# Patient Record
Sex: Female | Born: 1990
Health system: Southern US, Community
[De-identification: ages and names within clinical notes are randomized; demographics above are authoritative.]

## PROBLEM LIST (undated history)

## (undated) DIAGNOSIS — I10 Essential (primary) hypertension: Secondary | ICD-10-CM

## (undated) HISTORY — PX: PARTIAL HYSTERECTOMY: SHX80

---
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
## 2011-06-26 ENCOUNTER — Emergency Department (HOSPITAL_COMMUNITY)
Admission: EM | Admit: 2011-06-26 | Discharge: 2011-06-26 | Disposition: A | Discharge status: Home / Self Care | Payer: Medicaid Other | Attending: Emergency Medicine | Admitting: Emergency Medicine

## 2011-06-26 ENCOUNTER — Emergency Department (HOSPITAL_COMMUNITY): Payer: Medicaid Other

## 2011-06-26 ENCOUNTER — Encounter: Payer: Self-pay | Admitting: *Deleted

## 2011-06-26 DIAGNOSIS — S43109A Unspecified dislocation of unspecified acromioclavicular joint, initial encounter: Secondary | ICD-10-CM

## 2011-06-26 DIAGNOSIS — S92912A Unspecified fracture of left toe(s), initial encounter for closed fracture: Secondary | ICD-10-CM

## 2011-06-26 DIAGNOSIS — W010XXA Fall on same level from slipping, tripping and stumbling without subsequent striking against object, initial encounter: Secondary | ICD-10-CM | POA: Insufficient documentation

## 2011-06-26 DIAGNOSIS — S92919A Unspecified fracture of unspecified toe(s), initial encounter for closed fracture: Secondary | ICD-10-CM | POA: Insufficient documentation

## 2011-06-26 MED ORDER — OXYCODONE-ACETAMINOPHEN 5-325 MG PO TABS
1.0000 | ORAL_TABLET | Freq: Once | ORAL | Status: AC
  Administered 2011-06-26: 1 via ORAL
  Filled 2011-06-26: qty 1

## 2011-06-26 MED ORDER — OXYCODONE-ACETAMINOPHEN 5-325 MG PO TABS: 1.0000 | ORAL_TABLET | ORAL | Status: AC | PRN

## 2011-06-26 NOTE — ED Provider Notes (Signed)
History     CSN: [id number] Arrival date & time: 06/26/2011 10:42 PM  Chief Complaint  Patient presents with  . Fall   HPI Comments: Pt tripped while walking up steps, hitting her left 5th toe and landing on right shoulder No head injury No neck/back pain Reports bruising to left 5th toe  Patient is a 20 y.o. female presenting with fall. The history is provided by the patient.  Fall The accident occurred more than 2 days ago. The fall occurred while walking. The point of impact was the right shoulder. The pain is moderate. She was ambulatory at the scene. Pertinent negatives include no headaches and no loss of consciousness.    History reviewed. No pertinent past medical history.  History reviewed. No pertinent past surgical history.  No family history on file.  History  Substance Use Topics  . Smoking status: Never Smoker   . Smokeless tobacco: Not on file  . Alcohol Use: No    OB History    Grav Para Term Preterm Abortions TAB SAB Ect Mult Living                  Review of Systems  Musculoskeletal: Negative for back pain.  Neurological: Negative for loss of consciousness, weakness and headaches.    Physical Exam  BP 118/83  Pulse 107  Temp(Src) 98.2 F (36.8 C) (Oral)  Resp 18  Ht 5\' 3"  (1.6 m)  Wt 94 lb (42.638 kg)  BMI 16.65 kg/m2  SpO2 100%  LMP 06/03/2011  Physical Exam  CONSTITUTIONAL: Well developed/well nourished HEAD AND FACE: Normocephalic/atraumatic EYES: EOMI/PERRL ENMT: Mucous membranes moist NECK: supple no meningeal signs SPINE:entire spine nontender CV: S1/S2 noted, no murmurs/rubs/gallops noted LUNGS: Lungs are clear to auscultation bilaterally, no apparent distress ABDOMEN: soft, nontender, no rebound or guarding GU:no cva tenderness NEURO: Pt is awake/alert, moves all extremitiesx4 EXTREMITIES: pulses normal, full ROM, she is able to fully range the rightshoulder but is limited due to pain. No significant deformity to right  shoulder  Tender to palpation of left 5th toe with overlying bruising but no laceration.  No other bony tendernss to left foot/ankle SKIN: warm PSYCH: no abnormalities of mood noted   ED Course  Procedures  MDM Nursing notes reviewed and considered in documentation xrays reviewed and considered Pt reports she already has sling at home She reports she has orthopedist in [facility], MD 06/27/11 0105

## 2011-06-26 NOTE — ED Notes (Signed)
Pt reports she tripped going up some stairs 4 days ago landing on her rt shoulder and jamming her left 5th toe

## 2012-01-15 IMAGING — CR DG TOE 5TH 2+V*L*
2 series · 2 of 2 positions shown · non-contrast
Comparison: None.

CLINICAL DATA: Injury, pain.

LEFT TOE - 2+ VIEW

[view not recorded (1 of 2)]
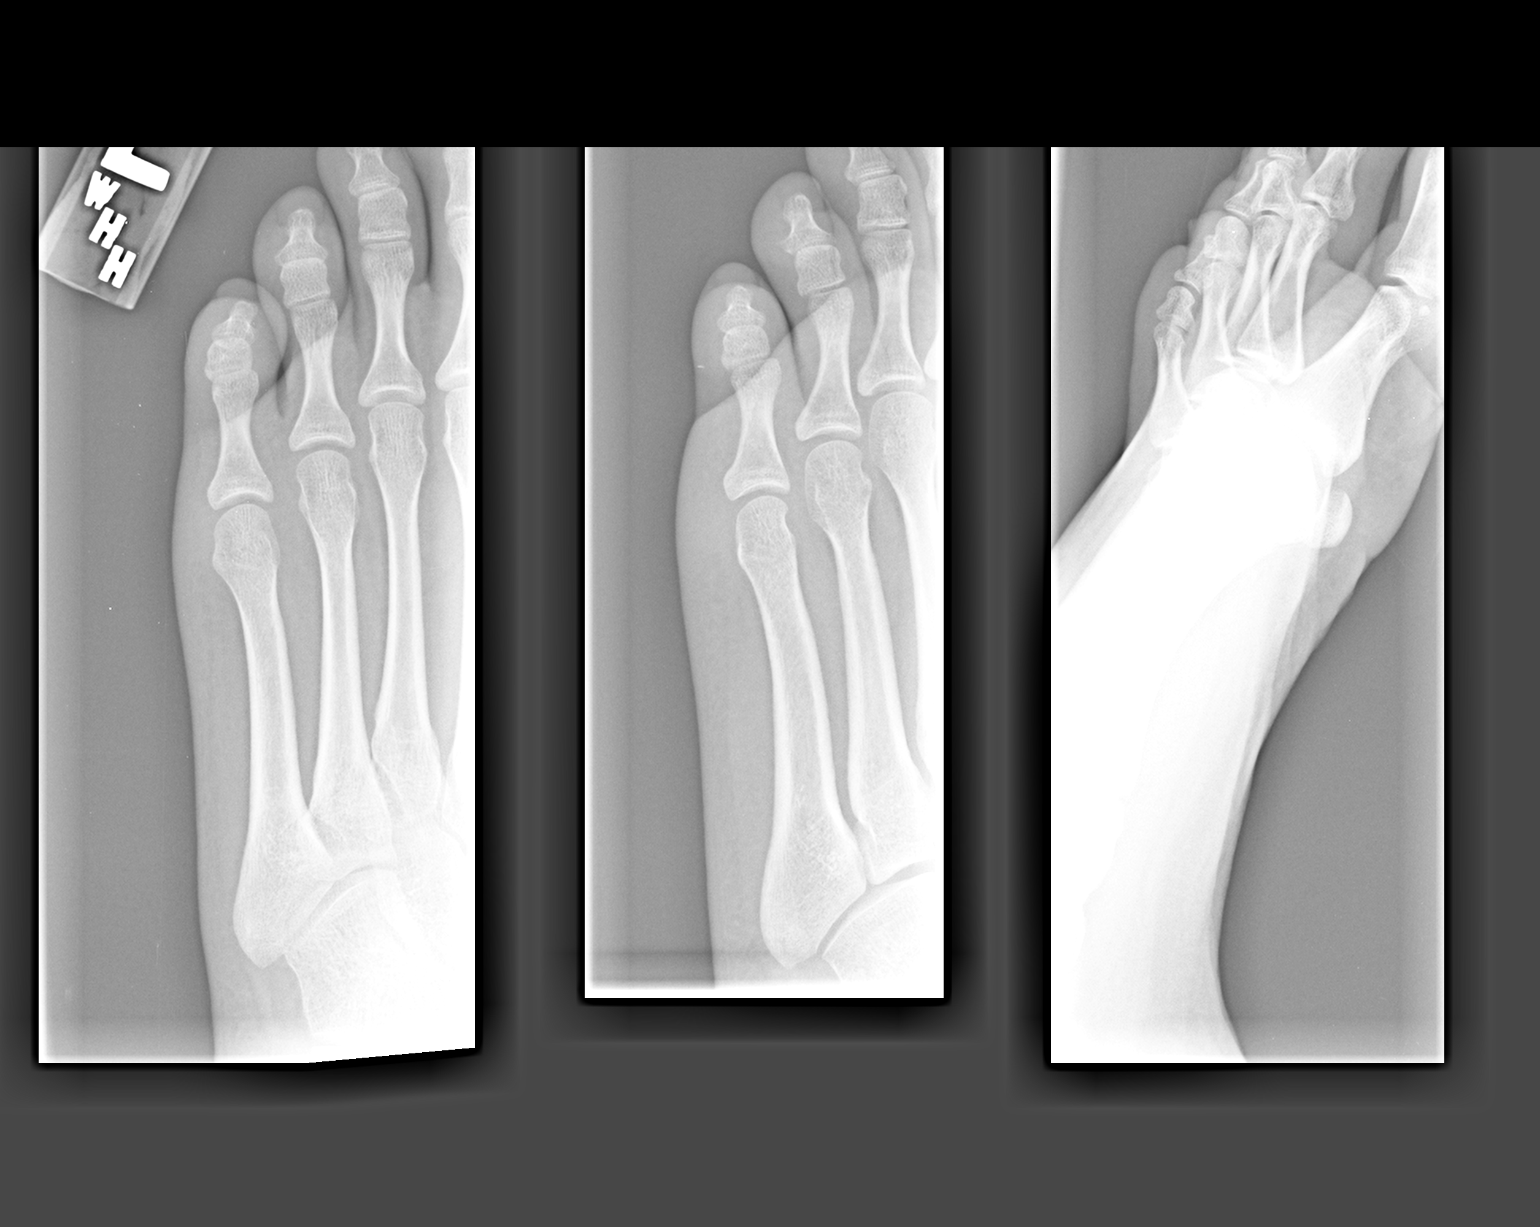

[view not recorded (2 of 2)]
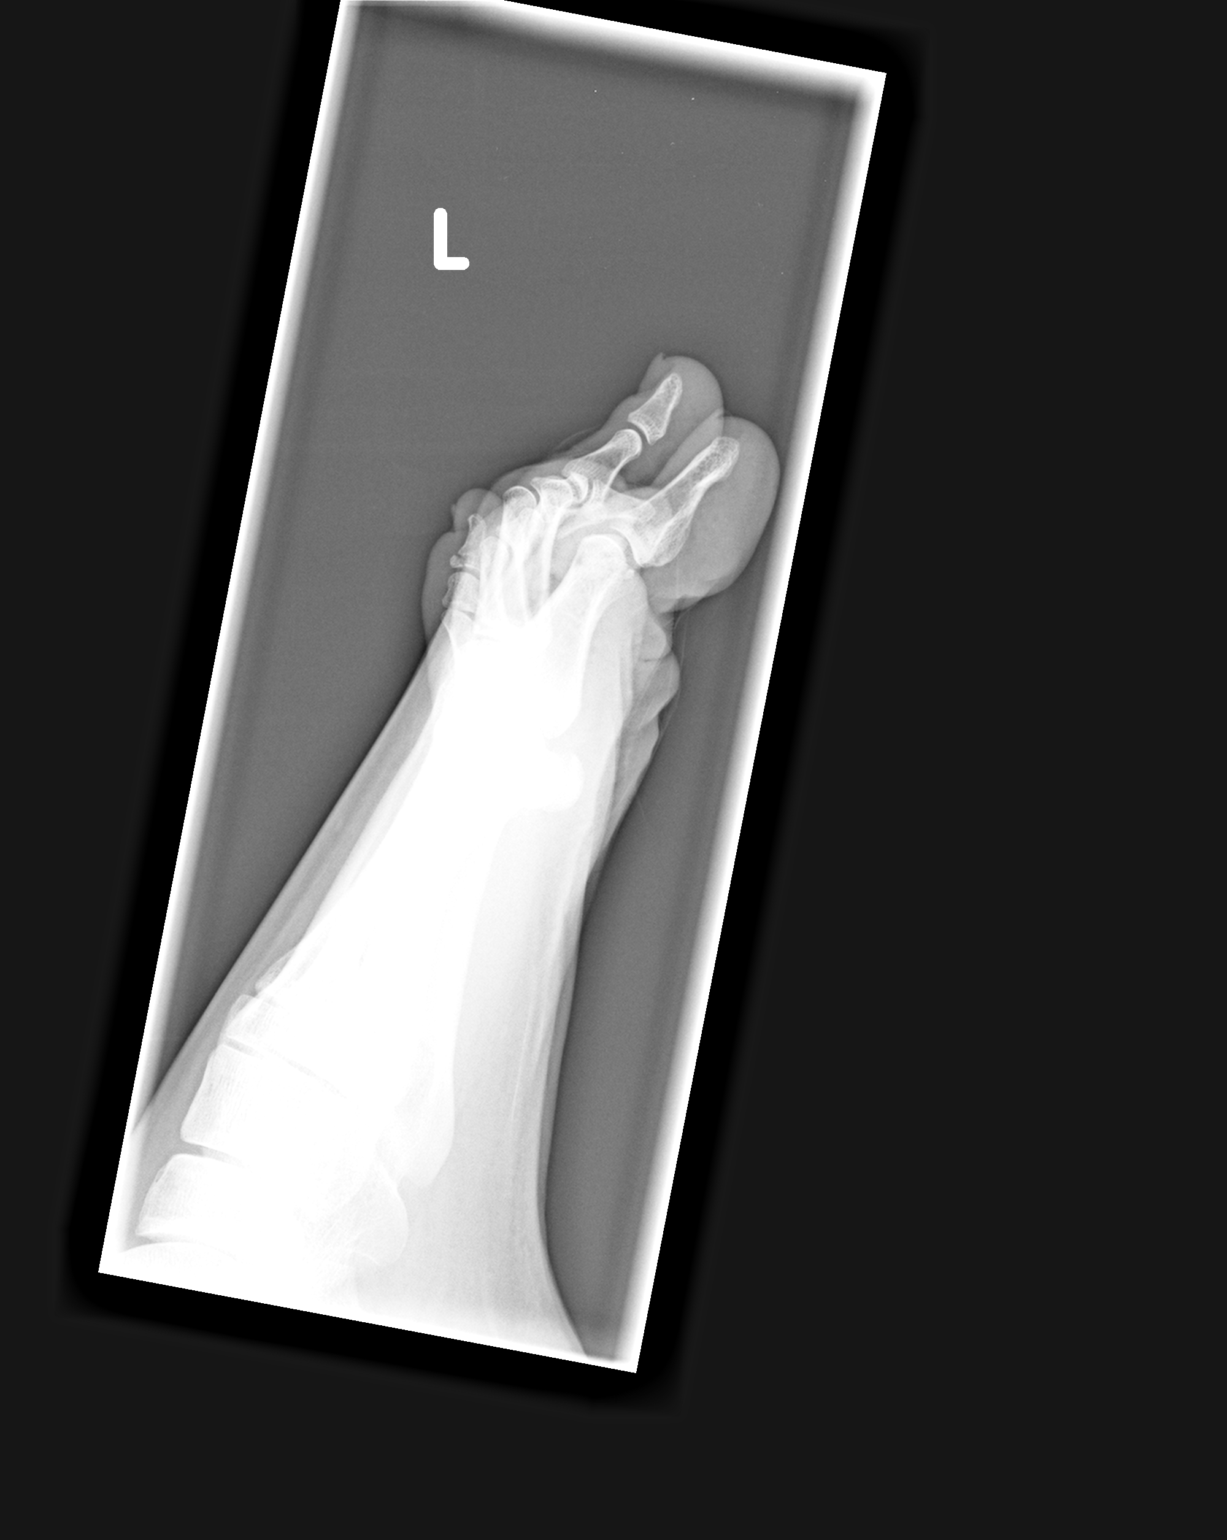

[2 of 2 positions shown; findings below may reference images not displayed]

FINDINGS: There is a dorsal plate fracture the distal phalanx of
the left fifth toe, best seen on the lateral view.  Minimal
displacement.  No additional acute bony abnormality.
IMPRESSION: Dorsal plate fracture through the distal phalanx of the left fifth
toe.

## 2012-10-24 ENCOUNTER — Encounter (HOSPITAL_COMMUNITY): Payer: Self-pay | Admitting: *Deleted

## 2012-10-24 ENCOUNTER — Emergency Department (HOSPITAL_COMMUNITY)
Admission: EM | Admit: 2012-10-24 | Discharge: 2012-10-24 | Disposition: A | Discharge status: Home / Self Care | Payer: Medicaid - Out of State | Attending: Emergency Medicine | Admitting: Emergency Medicine

## 2012-10-24 ENCOUNTER — Emergency Department (HOSPITAL_COMMUNITY): Payer: Medicaid - Out of State

## 2012-10-24 DIAGNOSIS — R071 Chest pain on breathing: Secondary | ICD-10-CM | POA: Insufficient documentation

## 2012-10-24 DIAGNOSIS — I10 Essential (primary) hypertension: Secondary | ICD-10-CM | POA: Insufficient documentation

## 2012-10-24 DIAGNOSIS — R000 Tachycardia, unspecified: Secondary | ICD-10-CM | POA: Insufficient documentation

## 2012-10-24 DIAGNOSIS — R079 Chest pain, unspecified: Secondary | ICD-10-CM

## 2012-10-24 HISTORY — DX: Essential (primary) hypertension: I10

## 2012-10-24 MED ORDER — OXYCODONE-ACETAMINOPHEN 5-325 MG PO TABS
ORAL_TABLET | ORAL | Status: AC
  Filled 2012-10-24: qty 1

## 2012-10-24 MED ORDER — IBUPROFEN 400 MG PO TABS
ORAL_TABLET | ORAL | Status: AC
  Filled 2012-10-24: qty 2

## 2012-10-24 MED ORDER — HYDROCODONE-ACETAMINOPHEN 5-325 MG PO TABS: 1.0000 | ORAL_TABLET | Freq: Four times a day (QID) | ORAL | Status: DC | PRN

## 2012-10-24 MED ORDER — IBUPROFEN 600 MG PO TABS: 600.0000 mg | ORAL_TABLET | Freq: Three times a day (TID) | ORAL | Status: DC | PRN

## 2012-10-24 MED ORDER — IBUPROFEN 400 MG PO TABS
600.0000 mg | ORAL_TABLET | Freq: Once | ORAL | Status: AC
  Administered 2012-10-24: 600 mg via ORAL

## 2012-10-24 MED ORDER — OXYCODONE-ACETAMINOPHEN 5-325 MG PO TABS
1.0000 | ORAL_TABLET | Freq: Once | ORAL | Status: AC
  Administered 2012-10-24: 1 via ORAL

## 2012-10-24 NOTE — ED Notes (Signed)
Pt c/o centralized chest pain with pressure. Pt also states that her heart feels like it is fluttering.

## 2012-10-24 NOTE — ED Provider Notes (Signed)
History     CSN: [id number]  Arrival date & time 10/24/12  5621   First MD Initiated Contact with Patient 10/24/12 0133      Chief Complaint  Patient presents with  . Chest Pain  . Tachycardia     The history is provided by the patient.   patient reports developing chest discomfort in her anterior central chest.  There is no radiation of her discomfort.  She states it feels like a heaviness.  It is worse with movement and palpation.  Her discomfort is worse when she lay flat and improves when she sits forward.  She tried Tylenol without improvement in her symptoms.  She is 21 years old.  No history of DVT or pulmonary embolism.  No history of coronary artery disease or early cardiac disease in family members.  No recent long travel or surgery.  The patient is on birth control.  She does not smoke cigarettes.  No fevers or chills.  No shortness of breath.  No abdominal pain nausea or vomiting.  No back pain.  Her pain is mild to moderate in severity.  Past Medical History  Diagnosis Date  . Hypertension     History reviewed. No pertinent past surgical history.  History reviewed. No pertinent family history.  History  Substance Use Topics  . Smoking status: Never Smoker   . Smokeless tobacco: Not on file  . Alcohol Use: No    OB History    Grav Para Term Preterm Abortions TAB SAB Ect Mult Living                  Review of Systems  Cardiovascular: Positive for chest pain.  All other systems reviewed and are negative.    Allergies  Review of patient's allergies indicates no known allergies.  Home Medications  No current outpatient prescriptions on file.  BP 118/79  Pulse 96  Temp 97.7 F (36.5 C)  Resp 20  Ht 5\' 2"  (1.575 m)  Wt 103 lb (46.72 kg)  BMI 18.84 kg/m2  SpO2 100%  LMP 10/16/2012  Physical Exam  Nursing note and vitals reviewed. Constitutional: She is oriented to person, place, and time. She appears well-developed and well-nourished. No  distress.  HENT:  Head: Normocephalic and atraumatic.  Eyes: EOM are normal.  Neck: Normal range of motion.  Cardiovascular: Normal rate, regular rhythm and normal heart sounds.   Pulmonary/Chest: Effort normal and breath sounds normal.       Tenderness to compression of anterior chest wall.  No rash noted.  Abdominal: Soft. She exhibits no distension. There is no tenderness.  Musculoskeletal: Normal range of motion.  Neurological: She is alert and oriented to person, place, and time.  Skin: Skin is warm and dry.  Psychiatric: She has a normal mood and affect. Judgment normal.    ED Course  Procedures (including critical care time)   Date: 10/24/2012  Rate: 77  Rhythm: normal sinus rhythm  QRS Axis: normal  Intervals: normal  ST/T Wave abnormalities: normal  Conduction Disutrbances: none  Narrative Interpretation:   Old EKG Reviewed: No significant changes noted     Labs Reviewed - No data to display Dg Chest 2 View  10/24/2012  *RADIOLOGY REPORT*  Clinical Data: Centralized chest pain and pressure.  CHEST - 2 VIEW  Comparison: None.  Findings: The lungs are well-aerated.  Mild bibasilar densities appear to reflect overlying soft tissues.  There is no evidence of focal opacification, pleural effusion or pneumothorax.  The heart is normal in size; the mediastinal contour is within normal limits.  No acute osseous abnormalities are seen.  IMPRESSION: No acute cardiopulmonary process seen.   Original Report Authenticated By: Tonia Ghent, M.D.    I personally reviewed the imaging tests through PACS system I reviewed available ER/hospitalization records through the EMR   1. Chest pain       MDM   PERC negative. cxr and ecg normal.  This is either pericarditis or costochondritis.  The patient be treated with pain medication and anti-inflammatories.  4:53 AM Patient feels much better at this time.  Discharge home in good condition.       Lyanne Co,  MD 10/24/12 [phone]

## 2012-10-24 NOTE — ED Notes (Signed)
Requests medication for chest pain.

## 2012-10-29 ENCOUNTER — Emergency Department (HOSPITAL_COMMUNITY)
Admission: EM | Admit: 2012-10-29 | Discharge: 2012-10-29 | Disposition: A | Discharge status: Home / Self Care | Payer: Medicaid - Out of State | Attending: Emergency Medicine | Admitting: Emergency Medicine

## 2012-10-29 ENCOUNTER — Encounter (HOSPITAL_COMMUNITY): Payer: Self-pay

## 2012-10-29 DIAGNOSIS — J3489 Other specified disorders of nose and nasal sinuses: Secondary | ICD-10-CM | POA: Insufficient documentation

## 2012-10-29 DIAGNOSIS — B338 Other specified viral diseases: Secondary | ICD-10-CM | POA: Insufficient documentation

## 2012-10-29 DIAGNOSIS — R05 Cough: Secondary | ICD-10-CM | POA: Insufficient documentation

## 2012-10-29 DIAGNOSIS — B349 Viral infection, unspecified: Secondary | ICD-10-CM

## 2012-10-29 DIAGNOSIS — J029 Acute pharyngitis, unspecified: Secondary | ICD-10-CM | POA: Insufficient documentation

## 2012-10-29 DIAGNOSIS — R059 Cough, unspecified: Secondary | ICD-10-CM | POA: Insufficient documentation

## 2012-10-29 DIAGNOSIS — I10 Essential (primary) hypertension: Secondary | ICD-10-CM | POA: Insufficient documentation

## 2012-10-29 LAB — RAPID STREP SCREEN (MED CTR MEBANE ONLY): Streptococcus, Group A Screen (Direct): NEGATIVE

## 2012-10-29 MED ORDER — GUAIFENESIN-CODEINE 100-10 MG/5ML PO SYRP: 10.0000 mL | ORAL_SOLUTION | Freq: Three times a day (TID) | ORAL | Status: AC | PRN

## 2012-10-29 MED ORDER — MAGIC MOUTHWASH W/LIDOCAINE: ORAL | Status: DC

## 2012-10-29 NOTE — ED Notes (Signed)
Pt reports sore throat since last night, thinks she may have strep throat. Also has cough

## 2012-10-29 NOTE — ED Notes (Signed)
Pt c/o sore throat and cough since last night. Pt states she has hx of strep throat and "this feels the same".

## 2012-10-29 NOTE — ED Provider Notes (Signed)
History     CSN: [id number]  Arrival date & time 10/29/12  1708   First MD Initiated Contact with Patient 10/29/12 1757      Chief Complaint  Patient presents with  . Sore Throat  . Cough    (Consider location/radiation/quality/duration/timing/severity/associated sxs/prior treatment) HPI Comments: Patient c/o occasionally productive cough and sore throat that began on the evening prior to ED arrival.  She denies fever, abd pain, vomiting , chest pain or shortness of breath  Patient is a 21 y.o. female presenting with pharyngitis. The history is provided by the patient.  Sore Throat This is a new problem. The current episode started yesterday. The problem occurs constantly. The problem has been unchanged. Associated symptoms include congestion, coughing and a sore throat. Pertinent negatives include no arthralgias, chest pain, chills, diaphoresis, fever, headaches, nausea, neck pain, numbness, rash, swollen glands, vertigo, vomiting or weakness. The symptoms are aggravated by swallowing. She has tried nothing for the symptoms. The treatment provided no relief.    Past Medical History  Diagnosis Date  . Hypertension     History reviewed. No pertinent past surgical history.  No family history on file.  History  Substance Use Topics  . Smoking status: Never Smoker   . Smokeless tobacco: Not on file  . Alcohol Use: No    OB History    Grav Para Term Preterm Abortions TAB SAB Ect Mult Living                  Review of Systems  Constitutional: Negative for fever, chills, diaphoresis, activity change and appetite change.  HENT: Positive for congestion, sore throat and rhinorrhea. Negative for facial swelling, trouble swallowing, neck pain and neck stiffness.   Eyes: Negative for visual disturbance.  Respiratory: Positive for cough. Negative for chest tightness, shortness of breath, wheezing and stridor.   Cardiovascular: Negative for chest pain.  Gastrointestinal: Negative  for nausea and vomiting.  Musculoskeletal: Negative for arthralgias.  Skin: Negative.  Negative for rash.  Neurological: Negative for dizziness, vertigo, weakness, numbness and headaches.  Hematological: Negative for adenopathy.  Psychiatric/Behavioral: Negative for confusion.  All other systems reviewed and are negative.    Allergies  Review of patient's allergies indicates no known allergies.  Home Medications   Current Outpatient Rx  Name  Route  Sig  Dispense  Refill  . IBUPROFEN 600 MG PO TABS   Oral   Take 1 tablet (600 mg total) by mouth every 8 (eight) hours as needed for pain.   15 tablet   0     BP 102/67  Pulse 110  Temp 98.3 F (36.8 C) (Oral)  Resp 20  SpO2 100%  LMP 10/16/2012  Physical Exam  Nursing note and vitals reviewed. Constitutional: She is oriented to person, place, and time. She appears well-developed and well-nourished. No distress.  HENT:  Head: Normocephalic and atraumatic. No trismus in the jaw.  Right Ear: Tympanic membrane and ear canal normal.  Left Ear: Tympanic membrane and ear canal normal.  Mouth/Throat: Uvula is midline and mucous membranes are normal. No uvula swelling. Posterior oropharyngeal edema and posterior oropharyngeal erythema present. No oropharyngeal exudate or tonsillar abscesses.  Neck: Normal range of motion. Neck supple.  Cardiovascular: Normal rate, regular rhythm, normal heart sounds and intact distal pulses.   No murmur heard. Pulmonary/Chest: Effort normal and breath sounds normal. No respiratory distress. She has no wheezes. She has no rales. She exhibits no tenderness.  Abdominal: Soft. She exhibits no distension.  There is no tenderness.  Musculoskeletal: Normal range of motion.  Lymphadenopathy:    She has no cervical adenopathy.  Neurological: She is alert and oriented to person, place, and time. She exhibits normal muscle tone. Coordination normal.  Skin: Skin is warm and dry.    ED Course  Procedures  (including critical care time)  Results for orders placed during the hospital encounter of 10/29/12  RAPID STREP SCREEN      Component Value Range   Streptococcus, Group A Screen (Direct) NEGATIVE  NEGATIVE         MDM    Patient is alert, nontoxic appearing. Previous ED chart reviewed by me patient denies chest pain today. Erythema of the oropharynx without exudates or edema. No tonsillar abscess. Rapid strep is negative , will treat patient symptomatically   Prescribed: Guaifenesin with codeine Magic mouthwash     Spencer Peterkin L. [place], [place] 10/31/12 2127

## 2012-10-31 NOTE — ED Provider Notes (Signed)
Medical screening examination/treatment/procedure(s) were performed by non-physician practitioner and as supervising physician I was immediately available for consultation/collaboration.  Anabelen Kaminsky, MD 10/31/12 2212 

## 2012-12-12 ENCOUNTER — Encounter (HOSPITAL_COMMUNITY): Payer: Self-pay | Admitting: *Deleted

## 2012-12-12 ENCOUNTER — Emergency Department (HOSPITAL_COMMUNITY)
Admission: EM | Admit: 2012-12-12 | Discharge: 2012-12-13 | Disposition: A | Discharge status: Home / Self Care | Payer: Medicaid - Out of State | Attending: Emergency Medicine | Admitting: Emergency Medicine

## 2012-12-12 DIAGNOSIS — I10 Essential (primary) hypertension: Secondary | ICD-10-CM | POA: Insufficient documentation

## 2012-12-12 DIAGNOSIS — N390 Urinary tract infection, site not specified: Secondary | ICD-10-CM | POA: Insufficient documentation

## 2012-12-12 DIAGNOSIS — H6123 Impacted cerumen, bilateral: Secondary | ICD-10-CM

## 2012-12-12 DIAGNOSIS — R112 Nausea with vomiting, unspecified: Secondary | ICD-10-CM | POA: Insufficient documentation

## 2012-12-12 DIAGNOSIS — B9789 Other viral agents as the cause of diseases classified elsewhere: Secondary | ICD-10-CM | POA: Insufficient documentation

## 2012-12-12 DIAGNOSIS — H6120 Impacted cerumen, unspecified ear: Secondary | ICD-10-CM | POA: Insufficient documentation

## 2012-12-12 DIAGNOSIS — E860 Dehydration: Secondary | ICD-10-CM | POA: Insufficient documentation

## 2012-12-12 DIAGNOSIS — B349 Viral infection, unspecified: Secondary | ICD-10-CM

## 2012-12-12 DIAGNOSIS — R509 Fever, unspecified: Secondary | ICD-10-CM | POA: Insufficient documentation

## 2012-12-12 DIAGNOSIS — E876 Hypokalemia: Secondary | ICD-10-CM | POA: Insufficient documentation

## 2012-12-12 LAB — POCT I-STAT, CHEM 8
Calcium, Ion: 1.1 mmol/L — ABNORMAL LOW (ref 1.12–1.23)
Creatinine, Ser: 0.8 mg/dL (ref 0.50–1.10)
Glucose, Bld: 96 mg/dL (ref 70–99)
Hemoglobin: 13.6 g/dL (ref 12.0–15.0)
Potassium: 2.7 mEq/L — CL (ref 3.5–5.1)
TCO2: 24 mmol/L (ref 0–100)

## 2012-12-12 MED ORDER — POTASSIUM CHLORIDE CRYS ER 20 MEQ PO TBCR
40.0000 meq | EXTENDED_RELEASE_TABLET | Freq: Once | ORAL | Status: AC
  Administered 2012-12-12: 40 meq via ORAL
  Filled 2012-12-12: qty 2

## 2012-12-12 MED ORDER — SODIUM CHLORIDE 0.9 % IV SOLN
1000.0000 mL | Freq: Once | INTRAVENOUS | Status: AC
  Administered 2012-12-12: 1000 mL via INTRAVENOUS

## 2012-12-12 MED ORDER — SODIUM CHLORIDE 0.9 % IV SOLN
1000.0000 mL | INTRAVENOUS | Status: DC
  Administered 2012-12-12 – 2012-12-13 (×2): 1000 mL via INTRAVENOUS

## 2012-12-12 MED ORDER — ACETAMINOPHEN 500 MG PO TABS
1000.0000 mg | ORAL_TABLET | Freq: Once | ORAL | Status: AC
  Administered 2012-12-12: 1000 mg via ORAL
  Filled 2012-12-12: qty 2

## 2012-12-12 MED ORDER — ONDANSETRON HCL 4 MG/2ML IJ SOLN
4.0000 mg | Freq: Once | INTRAMUSCULAR | Status: AC
  Administered 2012-12-12: 4 mg via INTRAVENOUS
  Filled 2012-12-12: qty 2

## 2012-12-12 NOTE — ED Notes (Addendum)
Vomiting, no diarrhea, lt ear hurts. Feels weak.   IUD was removed Thursday, has had vaginal bleeding since then.  Took motrin 800 at 7 pm

## 2012-12-12 NOTE — ED Provider Notes (Signed)
History    This chart was scribed for Scheffler Givens, MD by Charolett Bumpers, ED Scribe. The patient was seen in room [facility]18[facility]. Patient's care was started at 22:15.   CSN: [id number]  Arrival date & time 12/12/12  2135  First MD Initiated Contact with Patient 12/12/2012 2215     Chief Complaint  Patient presents with  . Emesis     The history is provided by the patient. No language interpreter was used.   Olivia Cordova is a 22 y.o. female who presents to the Emergency Department complaining of persistent emesis with associated nausea that started 2 days ago. She reports vomiting 8-9 times daily. She reports a fever of 100.5. Temperature here in ED is 101.5. She denies any abdominal pain, diarrhea, sneezing, dysuria, increased urination, dysuria. She does feel weak.  She also complains of left ear pain that started 2-3 days ago. She states that she had her IUD removed 4 days ago that was placed in Sept 2013 due to a recall. She states that she has had vaginal bleeding since. She last took Motrin at 7 pm tonight. 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 Hypertension     History reviewed. No pertinent past surgical history.  History reviewed. No pertinent family history.  History  Substance Use Topics  . Smoking status: Never Smoker   . Smokeless tobacco: Not on file  . Alcohol Use: No  She denies alcohol or tobacco use She works as an Lawyer at [employer] History   Grav Para Term Preterm Abortions TAB SAB Ect Mult Living                  Review of Systems  Constitutional: Positive for fever. Negative for appetite change.  HENT: Positive for ear pain. Negative for hearing loss.   Gastrointestinal: Positive for nausea and vomiting. Negative for abdominal pain and diarrhea.  All other systems reviewed and are negative.    Allergies  Review of patient's allergies indicates no known  allergies.  Home Medications   Current Outpatient Rx  Name  Route  Sig  Dispense  Refill  . Homeopathic Products (EARACHE RELIEF OT)   Otic   Place 2-4 drops in ear(s) every 4 (four) hours as needed (for relief).         Marland Kitchen ibuprofen (ADVIL,MOTRIN) 200 MG tablet   Oral   Take 800 mg by mouth 2 (two) times daily as needed for pain.           Triage Vitals: BP 116/68  Pulse 129  Temp(Src) 101.5 F (38.6 C) (Oral)  Resp 18  Ht 5\' 2"  (1.575 m)  Wt 82 lb (37.195 kg)  BMI 14.99 kg/m2  SpO2 100%  LMP 12/08/2012  Vital signs normal except for fever, tachycardia   Physical Exam  Nursing note and vitals reviewed. Constitutional: She is oriented to person, place, and time. She appears well-developed and well-nourished. No distress.  HENT:  Head: Normocephalic and atraumatic.  Right Ear: External ear normal.  Left Ear: External ear normal.  Nose: Nose normal.  Mouth/Throat: Oropharynx is clear and moist. No oropharyngeal exudate.  Cerumen impactions bilaterally with the left worse than right. Can hear fine finger rubbing bilaterally. Tongue is moist.   Eyes: Conjunctivae and EOM are normal. Pupils are equal, round, and reactive to light.  Neck: Normal range of  motion. Neck supple. No tracheal deviation present.  Cardiovascular: Normal rate, regular rhythm and normal heart sounds.  Exam reveals no gallop and no friction rub.   No murmur heard. Pulmonary/Chest: Effort normal and breath sounds normal. No respiratory distress. She has no wheezes. She has no rhonchi. She has no rales.  Abdominal: Soft. Bowel sounds are normal. She exhibits no distension. There is no tenderness. There is no rebound and no guarding.  Musculoskeletal: Normal range of motion. She exhibits no edema and no tenderness.  Neurological: She is alert and oriented to person, place, and time.  Skin: Skin is warm and dry.  Psychiatric: She has a normal mood and affect. Her behavior is normal.    ED Course   Procedures (including critical care time)  Medications  0.9 %  sodium chloride infusion (0 mLs Intravenous Stopped 12/12/12 2323)    Followed by  0.9 %  sodium chloride infusion (1,000 mLs Intravenous New Bag/Given 12/12/12 2323)  ibuprofen (ADVIL,MOTRIN) tablet 800 mg (not administered)  cefTRIAXone (ROCEPHIN) 1 g in dextrose 5 % 50 mL IVPB (not administered)  ondansetron (ZOFRAN) injection 4 mg (4 mg Intravenous Given 12/12/12 2241)  acetaminophen (TYLENOL) tablet 1,000 mg (1,000 mg Oral Given 12/12/12 2246)  potassium chloride SA (K-DUR,KLOR-CON) CR tablet 40 mEq (40 mEq Oral Given 12/12/12 2335)  metoCLOPramide (REGLAN) injection 10 mg (10 mg Intravenous Given 12/13/12 0004)  diphenhydrAMINE (BENADRYL) injection 25 mg ( Intravenous Given 12/13/12 0004)  sodium chloride 0.9 % bolus 1,000 mL (1,000 mLs Intravenous Bolus from Bag 12/13/12 0006)    DIAGNOSTIC STUDIES: Oxygen Saturation is 100% on room air, normal by my interpretation.    COORDINATION OF CARE:  22:21-Discussed planned course of treatment with the patient including IV fluids and Zofran, who is agreeable at this time.   Pt still having fever. Pt getting IV fluids. Started on oral potassium. She was given IV rocephin for her UTI. Pt states she doesn't want to be admitted, wants to go home.   Dr Colon Branch will determine her disposition time after change of shift.   Results for orders placed during the hospital encounter of 12/12/12  URINALYSIS, ROUTINE W REFLEX MICROSCOPIC      Result Value Range   Color, Urine YELLOW  YELLOW   APPearance CLOUDY (*) CLEAR   Specific Gravity, Urine 1.015  1.005 - 1.030   pH 6.0  5.0 - 8.0   Glucose, UA NEGATIVE  NEGATIVE mg/dL   Hgb urine dipstick SMALL (*) NEGATIVE   Bilirubin Urine NEGATIVE  NEGATIVE   Ketones, ur >80 (*) NEGATIVE mg/dL   Protein, ur 30 (*) NEGATIVE mg/dL   Urobilinogen, UA 0.2  0.0 - 1.0 mg/dL   Nitrite POSITIVE (*) NEGATIVE   Leukocytes, UA MODERATE (*) NEGATIVE  URINE  MICROSCOPIC-ADD ON      Result Value Range   WBC, UA TOO NUMEROUS TO COUNT  <3 WBC/hpf   RBC / HPF 3-6  <3 RBC/hpf   Bacteria, UA MANY (*) RARE  POCT I-STAT, CHEM 8      Result Value Range   Sodium 136  135 - 145 mEq/L   Potassium 2.7 (*) 3.5 - 5.1 mEq/L   Chloride 101  96 - 112 mEq/L   BUN 5 (*) 6 - 23 mg/dL   Creatinine, Ser 1.61  0.50 - 1.10 mg/dL   Glucose, Bld 96  70 - 99 mg/dL   Calcium, Ion 0.96 (*) 1.12 - 1.23 mmol/L   TCO2 24  0 -  100 mmol/L   Hemoglobin 13.6  12.0 - 15.0 g/dL   HCT 16.1  09.6 - 04.5 %   Comment NOTIFIED PHYSICIAN     Laboratory interpretation all normal except hypokalemia and UTI    1. Fever   2. Nausea and vomiting   3. Cerumen impaction, bilateral   4. Hypokalemia   5. Viral illness   6. Dehydration   7. UTI (lower urinary tract infection)    New Prescriptions   CEPHALEXIN (KEFLEX) 500 MG CAPSULE    Take 1 capsule (500 mg total) by mouth 3 (three) times daily.   DOCUSATE (COLACE) 50 MG/5ML LIQUID    Fill ear canals and let sit for 30 minutes then flush with warm water   ONDANSETRON (ZOFRAN ODT) 8 MG DISINTEGRATING TABLET    Take 1 tablet (8 mg total) by mouth every 8 (eight) hours as needed for nausea.   POTASSIUM CHLORIDE SA (K-DUR,KLOR-CON) 20 MEQ TABLET    Take 1 tablet (20 mEq total) by mouth 2 (two) times daily.    Plan discharge  Devoria Albe, MD, FACEP   MDM     I personally performed the services described in this documentation, which was scribed in my presence. The recorded information has been reviewed and considered.  Devoria Albe, MD, Armando Gang      Shadrick Givens, MD 12/13/12 Jacinta Shoe

## 2012-12-13 ENCOUNTER — Encounter (HOSPITAL_COMMUNITY): Payer: Self-pay | Admitting: *Deleted

## 2012-12-13 ENCOUNTER — Inpatient Hospital Stay (HOSPITAL_COMMUNITY)
Admission: EM | Admit: 2012-12-13 | Discharge: 2012-12-15 | DRG: 690 | Disposition: A | Discharge status: Home / Self Care | Payer: Medicaid - Out of State | Attending: Internal Medicine | Admitting: Internal Medicine

## 2012-12-13 DIAGNOSIS — N12 Tubulo-interstitial nephritis, not specified as acute or chronic: Secondary | ICD-10-CM | POA: Diagnosis present

## 2012-12-13 DIAGNOSIS — A498 Other bacterial infections of unspecified site: Secondary | ICD-10-CM | POA: Diagnosis present

## 2012-12-13 DIAGNOSIS — Z79899 Other long term (current) drug therapy: Secondary | ICD-10-CM

## 2012-12-13 DIAGNOSIS — E876 Hypokalemia: Secondary | ICD-10-CM | POA: Diagnosis not present

## 2012-12-13 DIAGNOSIS — D649 Anemia, unspecified: Secondary | ICD-10-CM | POA: Diagnosis present

## 2012-12-13 DIAGNOSIS — E875 Hyperkalemia: Secondary | ICD-10-CM | POA: Diagnosis present

## 2012-12-13 DIAGNOSIS — N10 Acute tubulo-interstitial nephritis: Principal | ICD-10-CM

## 2012-12-13 DIAGNOSIS — R1110 Vomiting, unspecified: Secondary | ICD-10-CM | POA: Diagnosis present

## 2012-12-13 DIAGNOSIS — E860 Dehydration: Secondary | ICD-10-CM | POA: Diagnosis present

## 2012-12-13 DIAGNOSIS — I10 Essential (primary) hypertension: Secondary | ICD-10-CM | POA: Diagnosis present

## 2012-12-13 DIAGNOSIS — R112 Nausea with vomiting, unspecified: Secondary | ICD-10-CM

## 2012-12-13 LAB — URINE MICROSCOPIC-ADD ON

## 2012-12-13 LAB — URINALYSIS, ROUTINE W REFLEX MICROSCOPIC
Bilirubin Urine: NEGATIVE
Glucose, UA: NEGATIVE mg/dL
Glucose, UA: NEGATIVE mg/dL
Protein, ur: 30 mg/dL — AB
Specific Gravity, Urine: 1.02 (ref 1.005–1.030)

## 2012-12-13 LAB — CBC WITH DIFFERENTIAL/PLATELET
Basophils Absolute: 0 10*3/uL (ref 0.0–0.1)
Eosinophils Absolute: 0 10*3/uL (ref 0.0–0.7)
Eosinophils Relative: 0 % (ref 0–5)
Lymphs Abs: 1.5 10*3/uL (ref 0.7–4.0)
MCH: 29.3 pg (ref 26.0–34.0)
MCV: 86.8 fL (ref 78.0–100.0)
Platelets: 328 10*3/uL (ref 150–400)
RDW: 13.5 % (ref 11.5–15.5)

## 2012-12-13 LAB — POCT I-STAT, CHEM 8
BUN: 4 mg/dL — ABNORMAL LOW (ref 6–23)
Calcium, Ion: 1.1 mmol/L — ABNORMAL LOW (ref 1.12–1.23)
Creatinine, Ser: 0.6 mg/dL (ref 0.50–1.10)
TCO2: 22 mmol/L (ref 0–100)

## 2012-12-13 MED ORDER — METOCLOPRAMIDE HCL 5 MG/ML IJ SOLN
10.0000 mg | Freq: Once | INTRAMUSCULAR | Status: AC
  Administered 2012-12-13: 10 mg via INTRAVENOUS
  Filled 2012-12-13: qty 2

## 2012-12-13 MED ORDER — ACETAMINOPHEN 325 MG PO TABS: 650.0000 mg | ORAL_TABLET | ORAL | Status: DC | PRN

## 2012-12-13 MED ORDER — DEXTROSE 5 % IV SOLN
1.0000 g | INTRAVENOUS | Status: DC
  Filled 2012-12-13: qty 10

## 2012-12-13 MED ORDER — IBUPROFEN 800 MG PO TABS
800.0000 mg | ORAL_TABLET | Freq: Once | ORAL | Status: AC
  Administered 2012-12-13: 800 mg via ORAL
  Filled 2012-12-13: qty 1

## 2012-12-13 MED ORDER — KETOROLAC TROMETHAMINE 30 MG/ML IJ SOLN
30.0000 mg | Freq: Once | INTRAMUSCULAR | Status: AC
  Administered 2012-12-13: 30 mg via INTRAVENOUS
  Filled 2012-12-13: qty 1

## 2012-12-13 MED ORDER — SODIUM CHLORIDE 0.9 % IV SOLN
1000.0000 mL | Freq: Once | INTRAVENOUS | Status: AC
  Administered 2012-12-13: 1000 mL via INTRAVENOUS

## 2012-12-13 MED ORDER — ONDANSETRON HCL 4 MG/2ML IJ SOLN
4.0000 mg | INTRAMUSCULAR | Status: DC | PRN
  Administered 2012-12-14: 4 mg via INTRAVENOUS
  Filled 2012-12-13: qty 2

## 2012-12-13 MED ORDER — SODIUM CHLORIDE 0.9 % IV SOLN
1000.0000 mL | INTRAVENOUS | Status: DC
  Administered 2012-12-13: 1000 mL via INTRAVENOUS

## 2012-12-13 MED ORDER — MORPHINE SULFATE 4 MG/ML IJ SOLN
4.0000 mg | Freq: Once | INTRAMUSCULAR | Status: AC
  Administered 2012-12-13: 4 mg via INTRAVENOUS
  Filled 2012-12-13: qty 1

## 2012-12-13 MED ORDER — ONDANSETRON 8 MG PO TBDP: 8.0000 mg | ORAL_TABLET | Freq: Three times a day (TID) | ORAL | Status: DC | PRN

## 2012-12-13 MED ORDER — SODIUM CHLORIDE 0.9 % IV SOLN
INTRAVENOUS | Status: DC
  Administered 2012-12-13: 23:00:00 via INTRAVENOUS

## 2012-12-13 MED ORDER — DIPHENHYDRAMINE HCL 25 MG PO CAPS
50.0000 mg | ORAL_CAPSULE | Freq: Four times a day (QID) | ORAL | Status: DC | PRN
  Administered 2012-12-13: 50 mg via ORAL
  Filled 2012-12-13: qty 2

## 2012-12-13 MED ORDER — DEXTROSE 5 % IV SOLN
1.0000 g | Freq: Once | INTRAVENOUS | Status: AC
  Administered 2012-12-13: 1 g via INTRAVENOUS
  Filled 2012-12-13: qty 10

## 2012-12-13 MED ORDER — DIPHENHYDRAMINE HCL 50 MG/ML IJ SOLN
25.0000 mg | Freq: Once | INTRAMUSCULAR | Status: AC
  Administered 2012-12-13: via INTRAVENOUS
  Filled 2012-12-13: qty 1

## 2012-12-13 MED ORDER — SODIUM CHLORIDE 0.9 % IV BOLUS (SEPSIS)
1000.0000 mL | Freq: Once | INTRAVENOUS | Status: AC
  Administered 2012-12-13: 1000 mL via INTRAVENOUS

## 2012-12-13 MED ORDER — DIPHENHYDRAMINE HCL 50 MG/ML IJ SOLN
25.0000 mg | Freq: Once | INTRAMUSCULAR | Status: AC
  Administered 2012-12-13: 25 mg via INTRAVENOUS
  Filled 2012-12-13: qty 1

## 2012-12-13 MED ORDER — ENOXAPARIN SODIUM 40 MG/0.4ML ~~LOC~~ SOLN: 40.0000 mg | SUBCUTANEOUS | Status: DC

## 2012-12-13 MED ORDER — POTASSIUM CHLORIDE CRYS ER 20 MEQ PO TBCR: 20.0000 meq | EXTENDED_RELEASE_TABLET | Freq: Two times a day (BID) | ORAL | Status: DC

## 2012-12-13 MED ORDER — ENOXAPARIN SODIUM 30 MG/0.3ML ~~LOC~~ SOLN
30.0000 mg | SUBCUTANEOUS | Status: DC
  Administered 2012-12-14: 30 mg via SUBCUTANEOUS
  Filled 2012-12-13: qty 0.3

## 2012-12-13 MED ORDER — DOCUSATE SODIUM 50 MG/5ML PO LIQD: ORAL | Status: DC

## 2012-12-13 MED ORDER — CEPHALEXIN 500 MG PO CAPS: 500.0000 mg | ORAL_CAPSULE | Freq: Three times a day (TID) | ORAL | Status: DC

## 2012-12-13 MED ORDER — IBUPROFEN 800 MG PO TABS
400.0000 mg | ORAL_TABLET | Freq: Four times a day (QID) | ORAL | Status: DC | PRN
  Administered 2012-12-14: 400 mg via ORAL
  Filled 2012-12-13: qty 1

## 2012-12-13 MED ORDER — DEXTROSE 5 % IV SOLN
1.0000 g | Freq: Once | INTRAVENOUS | Status: AC
  Administered 2012-12-13: 1 g via INTRAVENOUS
  Filled 2012-12-13 (×2): qty 10

## 2012-12-13 MED ORDER — TRAZODONE HCL 50 MG PO TABS: 50.0000 mg | ORAL_TABLET | Freq: Every evening | ORAL | Status: DC | PRN

## 2012-12-13 NOTE — H&P (Signed)
[facility] History and Physical  Olivia Cordova  ZOX:[id number]  DOB: 11-16-1990   DOA: 12/13/2012   PCP:   Tilda Burrow, MD   Chief Complaint:  Nausea and vomiting worse today  HPI: Olivia Cordova is an 22 y.o. female.   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 She self medicated with ibuprofen 800 mg; symptoms progressed to include nausea and vomiting, and eventually came to the emergency room yesterday when symptoms fail to resolve. She was evaluated found to have a urinary tract infection and sent home with Keflex.  However vomiting persists, she is unable to keep down medications, and she is having increased frequency and dysuria. The back pain is over her entire lower back, but is accentuated on the right side.  Rewiew of Systems:   All systems negative except as marked bold or noted in the HPI;  Constitutional:    malaise, fever and chills. ;  Eyes:   eye pain, redness and discharge. ;  ENMT:   ear pain, hoarseness, nasal congestion, sinus pressure and sore throat. ;  Cardiovascular:    chest pain, palpitations, diaphoresis, dyspnea and peripheral edema.  Respiratory:   cough, hemoptysis, wheezing and stridor. ;  Gastrointestinal:  nausea, vomiting, diarrhea, constipation, abdominal pain, melena, blood in stool, hematemesis, jaundice and rectal bleeding. unusual weight loss..   Genitourinary:    frequency, dysuria, incontinence,flank pain and hematuria; Musculoskeletal:   back pain and neck pain.  swelling and trauma.;  Skin: .  pruritus, rash, abrasions, bruising and skin lesion.; ulcerations Neuro:    headache, lightheadedness and neck stiffness.  weakness, altered level of consciousness, altered mental status, extremity weakness, burning feet, involuntary movement, seizure and syncope.  Psych:    anxiety,  depression, insomnia, tearfulness, panic attacks, hallucinations, paranoia, suicidal or homicidal ideation     Past Medical History  Diagnosis Date  . Hypertension     History reviewed. No pertinent past surgical history.  Medications:  HOME MEDS: Prior to Admission medications   Medication Sig Start Date End Date Taking? Authorizing Provider  cephALEXin (KEFLEX) 500 MG capsule Take 1 capsule (500 mg total) by mouth 3 (three) times daily. 12/13/12  Yes Soth Givens, MD  ibuprofen (ADVIL,MOTRIN) 200 MG tablet Take 800 mg by mouth 2 (two) times daily as needed for pain.   Yes Historical Provider, MD  ondansetron (ZOFRAN ODT) 8 MG disintegrating tablet Take 1 tablet (8 mg total) by mouth every 8 (eight) hours as needed for nausea. 12/13/12  Yes Manfredonia Givens, MD  potassium chloride SA (K-DUR,KLOR-CON) 20 MEQ tablet Take 1 tablet (20 mEq total) by mouth 2 (two) times daily. 12/13/12  Yes Ziemann Givens, MD  docusate (COLACE) 50 MG/5ML liquid Fill ear canals and let sit for 30 minutes then flush with warm water 12/13/12   Kilker Givens, MD     Allergies:  No Known Allergies  Social History:   reports that she has never smoked. She does not have any smokeless tobacco history on file. She reports that she does not drink alcohol or use illicit drugs.  Family History: No family history on file. No family history of kidney troubles  Physical Exam: Filed Vitals:   12/13/12 1916  BP: 120/70  Pulse: 114  Temp: 100.2 F (37.9 C)  TempSrc: Oral  Resp: 18  Height:  5\' 2"  (1.575 m)  Weight: 37.195 kg (82 lb)  SpO2: 100%   Blood pressure 120/70, pulse 114, temperature 100.2 F (37.9 C), temperature source Oral, resp. rate 18, height 5\' 2"  (1.575 m), weight 37.195 kg (82 lb), last menstrual period 12/08/2012, SpO2 100.00%.  GEN:  Pleasant young Caucasian lady lying in the stretcher in no acute distress; cooperative with exam PSYCH:  alert and oriented x4; does not appear anxious or depressed;  affect is appropriate. HEENT: Mucous membranes pink,dry, and anicteric; PERRLA; EOM intact; no cervical lymphadenopathy nor thyromegaly or carotid bruit; no JVD; Breasts:: Not examined CHEST WALL: No tenderness CHEST: Normal respiration, clear to auscultation bilaterally HEART: Tachycardic regular rate and rhythm; no murmurs rubs or gallops BACK: No kyphosis or scoliosis; right CVA tenderness ABDOMEN: Scaphoid, soft non-tender; no masses, no organomegaly, normal abdominal bowel sounds; no pannus; no intertriginous candida. Rectal Exam: Not done EXTREMITIES: No bone or joint deformityno edema; no ulcerations. Genitalia: not examined PULSES: 2+ and symmetric SKIN: Normal hydration no rash or ulceration CNS: Cranial nerves 2-12 grossly intact no focal lateralizing neurologic deficit   Labs on Admission:  Basic Metabolic Panel:  Recent Labs Lab 12/12/12 2237 12/13/12 2019  NA 136 135  K 2.7* 5.4*  CL 101 107  GLUCOSE 96 84  BUN 5* 4*  CREATININE 0.80 0.60   Liver Function Tests: No results found for this basename: AST, ALT, ALKPHOS, BILITOT, PROT, ALBUMIN,  in the last 168 hours No results found for this basename: LIPASE, AMYLASE,  in the last 168 hours No results found for this basename: AMMONIA,  in the last 168 hours CBC:  Recent Labs Lab 12/12/12 2237 12/13/12 2012 12/13/12 2019  WBC  --  13.4*  --   NEUTROABS  --  10.8*  --   HGB 13.6 10.4* 10.9*  HCT 40.0 30.8* 32.0*  MCV  --  86.8  --   PLT  --  328  --    Cardiac Enzymes: No results found for this basename: CKTOTAL, CKMB, CKMBINDEX, TROPONINI,  in the last 168 hours BNP: No components found with this basename: POCBNP,  D-dimer: No components found with this basename: D-DIMER,  CBG: No results found for this basename: GLUCAP,  in the last 168 hours  Radiological Exams on Admission: No results found.    Assessment/Plan Present on Admission:  . Dehydration . Hyperkalemia, likely due to excess  replacement  . Pyelonephritis Anemia  PLAN: We'll admit this lady for intravenous antibiotic therapy; and antiemetics Clear liquid diet advance as tolerated Hydrate her sufficiently to use NSAIDs for pain control, and avoid narcotics There's been a noticeable drop in hemoglobin, and this may be due to correction of prior dehydration; Will checks 2 Hemoccult, and anemia panel   Code Status: *FULL CODE  Disposition Plan: Discharge to home when stable    Yu Peggs Nocturnist [facility]  12/13/2012, 10:33 PM

## 2012-12-13 NOTE — ED Notes (Signed)
Pt was seen here yesterday & now feels worse. Started on antibiotics for uti. Pt states her fever will not go down & has been vomiting.

## 2012-12-13 NOTE — ED Notes (Signed)
Rocephin bag leaked. Wasted in [place] and new bag removed.

## 2012-12-13 NOTE — ED Notes (Signed)
Pt unable to void at this time, reports she has not voided today.

## 2012-12-13 NOTE — Progress Notes (Signed)
Pt c/o itching around face and legs. Request benadryl. Dr. Orvan Falconer notified via text page. New orders received. Will continue to monitor.

## 2012-12-13 NOTE — ED Provider Notes (Signed)
History    This chart was scribed for Furgason Givens, MD by Leone Payor, ED Scribe. This patient was seen in room APA11/[facility]11 and the patient's care was started 7:40 PM.   CSN: [id number]  Arrival date & time 12/13/12  1910   First MD Initiated Contact with Patient 12/13/12 1935      Chief Complaint  Patient presents with  . Urinary Tract Infection  . Emesis  . Fever     The history is provided by the patient. No language interpreter was used.    Olivia Cordova is a 21 y.o. female who presents to the Emergency Department complaining of an ongoing, gradually worsening, constant UTI with associated fever (104.2 three hours PTA), vomiting (x15 today), flank pain on both sides, dysuria, dribbling, urgency, frequency. Pt was seen in the ED yesterday for the same symptoms and states she feels worse. After leaving ED after MN pt reports being very sleepy. She states she went to sleep and woke up at 5:30 AM this morning and started vomiting again. Pt states she was unable to keep the prescribed antibiotics down. She denies diarrhea today.   PT was seen last night with vomiting x 2 days and fever to 102 in the ED with UA showing TNTC WBC. She was given IV rocephin and sent home with keflex orally which she has been unable to keep down. Pt did not want to be admitted last night when offered. She states the flank pain started today.    PCP Dr. Emelda Fear.    Past Medical History  Diagnosis Date  . Hypertension     History reviewed. No pertinent past surgical history.  History reviewed. No pertinent family history.  History  Substance Use Topics  . Smoking status: Never Smoker   . Smokeless tobacco: Never Used  . Alcohol Use: No  employed as a Lawyer at [employer]  No OB history provided.   Review of Systems A complete 10 system review of systems was obtained and all systems are negative except as noted in the HPI and PMH.    Allergies  Review of patient's allergies indicates no known  allergies.  Home Medications   Current Outpatient Rx  Name  Route  Sig  Dispense  Refill  . cephALEXin (KEFLEX) 500 MG capsule   Oral   Take 1 capsule (500 mg total) by mouth 3 (three) times daily.   30 capsule   0   . docusate (COLACE) 50 MG/5ML liquid      Fill ear canals and let sit for 30 minutes then flush with warm water   30 mL   0   . Homeopathic Products (EARACHE RELIEF OT)   Otic   Place 2-4 drops in ear(s) every 4 (four) hours as needed (for relief).         Marland Kitchen ibuprofen (ADVIL,MOTRIN) 200 MG tablet   Oral   Take 800 mg by mouth 2 (two) times daily as needed for pain.         Marland Kitchen ondansetron (ZOFRAN ODT) 8 MG disintegrating tablet   Oral   Take 1 tablet (8 mg total) by mouth every 8 (eight) hours as needed for nausea.   20 tablet   0   . potassium chloride SA (K-DUR,KLOR-CON) 20 MEQ tablet   Oral   Take 1 tablet (20 mEq total) by mouth 2 (two) times daily.   14 tablet   0     BP 120/70  Pulse 114  Temp(Src)  100.2 F (37.9 C) (Oral)  Resp 18  Ht 5\' 2"  (1.575 m)  Wt 82 lb (37.195 kg)  BMI 14.99 kg/m2  SpO2 100%  LMP 12/08/2012  Vital signs normal except tachycardia, low-grade temp  Physical Exam  Nursing note and vitals reviewed. Constitutional: She is oriented to person, place, and time. She appears well-developed and well-nourished.  Non-toxic appearance. She does not appear ill. She appears distressed.  Crying, tearful  HENT:  Head: Normocephalic and atraumatic.  Right Ear: External ear normal.  Left Ear: External ear normal.  Nose: Nose normal. No mucosal edema or rhinorrhea.  Mouth/Throat: Mucous membranes are dry. No dental abscesses or edematous.  Tongues dry  Eyes: Conjunctivae and EOM are normal. Pupils are equal, round, and reactive to light.  Neck: Normal range of motion and full passive range of motion without pain. Neck supple.  Cardiovascular: Normal rate, regular rhythm and normal heart sounds.  Exam reveals no gallop and no  friction rub.   No murmur heard. Pulmonary/Chest: Effort normal and breath sounds normal. No respiratory distress. She has no wheezes. She has no rhonchi. She has no rales. She exhibits no tenderness and no crepitus.  Abdominal: Soft. Normal appearance and bowel sounds are normal. She exhibits no distension. There is no tenderness. There is no rebound and no guarding.  Genitourinary:  CVA tenderness right worse than left.   Musculoskeletal: Normal range of motion. She exhibits no edema and no tenderness.  Moves all extremities well.   Neurological: She is alert and oriented to person, place, and time. She has normal strength. No cranial nerve deficit.  Skin: Skin is warm, dry and intact. No rash noted. No erythema. No pallor.  Psychiatric: Her speech is normal and behavior is normal. Her mood appears not anxious.  tearful    ED Course  Procedures (including critical care time)  Medications  ondansetron (ZOFRAN) injection 4 mg (not administered)  cefTRIAXone (ROCEPHIN) 1 g in dextrose 5 % 50 mL IVPB (not administered)  0.9 %  sodium chloride infusion (0 mLs Intravenous Stopped 12/13/12 2104)    Followed by  0.9 %  sodium chloride infusion (0 mLs Intravenous Stopped 12/13/12 2149)  cefTRIAXone (ROCEPHIN) 1 g in dextrose 5 % 50 mL IVPB (0 g Intravenous Stopped 12/13/12 2115)  ketorolac (TORADOL) 30 MG/ML injection 30 mg (30 mg Intravenous Given 12/13/12 2026)  morphine 4 MG/ML injection 4 mg (4 mg Intravenous Given 12/13/12 2030)  metoCLOPramide (REGLAN) injection 10 mg (10 mg Intravenous Given 12/13/12 2023)  diphenhydrAMINE (BENADRYL) injection 25 mg (25 mg Intravenous Given 12/13/12 2020)    DIAGNOSTIC STUDIES: Oxygen Saturation is 100% on room air, normal by my interpretation.    COORDINATION OF CARE: 7:49 PM Discussed treatment plan which includes UA, blood culture with pt at bedside and pt agreed to plan.   21:20 Dr Orvan Falconer will admit to med-surg bed  Results for orders placed  during the hospital encounter of 12/13/12  URINALYSIS, ROUTINE W REFLEX MICROSCOPIC      Result Value Range   Color, Urine YELLOW  YELLOW   APPearance CLEAR  CLEAR   Specific Gravity, Urine 1.020  1.005 - 1.030   pH 5.5  5.0 - 8.0   Glucose, UA NEGATIVE  NEGATIVE mg/dL   Hgb urine dipstick TRACE (*) NEGATIVE   Bilirubin Urine NEGATIVE  NEGATIVE   Ketones, ur 40 (*) NEGATIVE mg/dL   Protein, ur TRACE (*) NEGATIVE mg/dL   Urobilinogen, UA 0.2  0.0 - 1.0 mg/dL  Nitrite NEGATIVE  NEGATIVE   Leukocytes, UA NEGATIVE  NEGATIVE  CBC WITH DIFFERENTIAL      Result Value Range   WBC 13.4 (*) 4.0 - 10.5 K/uL   RBC 3.55 (*) 3.87 - 5.11 MIL/uL   Hemoglobin 10.4 (*) 12.0 - 15.0 g/dL   HCT 16.1 (*) 09.6 - 04.5 %   MCV 86.8  78.0 - 100.0 fL   MCH 29.3  26.0 - 34.0 pg   MCHC 33.8  30.0 - 36.0 g/dL   RDW 40.9  81.1 - 91.4 %   Platelets 328  150 - 400 K/uL   Neutrophils Relative 81 (*) 43 - 77 %   Neutro Abs 10.8 (*) 1.7 - 7.7 K/uL   Lymphocytes Relative 11 (*) 12 - 46 %   Lymphs Abs 1.5  0.7 - 4.0 K/uL   Monocytes Relative 8  3 - 12 %   Monocytes Absolute 1.1 (*) 0.1 - 1.0 K/uL   Eosinophils Relative 0  0 - 5 %   Eosinophils Absolute 0.0  0.0 - 0.7 K/uL   Basophils Relative 0  0 - 1 %   Basophils Absolute 0.0  0.0 - 0.1 K/uL  URINE MICROSCOPIC-ADD ON      Result Value Range   Squamous Epithelial / LPF FEW (*) RARE   WBC, UA 3-6  <3 WBC/hpf   RBC / HPF 3-6  <3 RBC/hpf   Bacteria, UA FEW (*) RARE  POCT I-STAT, CHEM 8      Result Value Range   Sodium 135  135 - 145 mEq/L   Potassium 5.4 (*) 3.5 - 5.1 mEq/L   Chloride 107  96 - 112 mEq/L   BUN 4 (*) 6 - 23 mg/dL   Creatinine, Ser 7.82  0.50 - 1.10 mg/dL   Glucose, Bld 84  70 - 99 mg/dL   Calcium, Ion 9.56 (*) 1.12 - 1.23 mmol/L   TCO2 22  0 - 100 mmol/L   Hemoglobin 10.9 (*) 12.0 - 15.0 g/dL   HCT 21.3 (*) 08.6 - 57.8 %   Laboratory interpretation all normal except hyperkalemia, leukocytosis      1. Pyelonephritis, acute   2.  Nausea and vomiting   3. Dehydration    Plan admission  Devoria Albe, MD, FACEP    MDM  I personally performed the services described in this documentation, which was scribed in my presence. The recorded information has been reviewed and considered.  Devoria Albe, MD, FACEP }   Rybarczyk Givens, MD 12/14/12 [phone]

## 2012-12-14 DIAGNOSIS — D649 Anemia, unspecified: Secondary | ICD-10-CM | POA: Diagnosis present

## 2012-12-14 DIAGNOSIS — R112 Nausea with vomiting, unspecified: Secondary | ICD-10-CM

## 2012-12-14 DIAGNOSIS — E876 Hypokalemia: Secondary | ICD-10-CM

## 2012-12-14 LAB — IRON AND TIBC: Iron: <10 ug/dL — ABNORMAL LOW (ref 42–135)

## 2012-12-14 LAB — COMPREHENSIVE METABOLIC PANEL
ALT: 9 U/L (ref 0–35)
Alkaline Phosphatase: 64 U/L (ref 39–117)
BUN: 5 mg/dL — ABNORMAL LOW (ref 6–23)
Chloride: 109 mEq/L (ref 96–112)
GFR calc Af Amer: >90 mL/min (ref >90)
Glucose, Bld: 83 mg/dL (ref 70–99)
Potassium: 3.2 mEq/L — ABNORMAL LOW (ref 3.5–5.1)
Sodium: 138 mEq/L (ref 135–145)
Total Bilirubin: 0.2 mg/dL — ABNORMAL LOW (ref 0.3–1.2)
Total Protein: 5.6 g/dL — ABNORMAL LOW (ref 6.0–8.3)

## 2012-12-14 LAB — CBC
HCT: 25.8 % — ABNORMAL LOW (ref 36.0–46.0)
Hemoglobin: 8.6 g/dL — ABNORMAL LOW (ref 12.0–15.0)
RBC: 2.94 MIL/uL — ABNORMAL LOW (ref 3.87–5.11)
WBC: 11.8 10*3/uL — ABNORMAL HIGH (ref 4.0–10.5)

## 2012-12-14 LAB — URINE CULTURE: Colony Count: >=100000

## 2012-12-14 LAB — VITAMIN B12: Vitamin B-12: 285 pg/mL (ref 211–911)

## 2012-12-14 LAB — PREGNANCY, URINE: Preg Test, Ur: NEGATIVE

## 2012-12-14 LAB — MAGNESIUM: Magnesium: 1.9 mg/dL (ref 1.5–2.5)

## 2012-12-14 MED ORDER — PROMETHAZINE HCL 12.5 MG PO TABS: 12.5000 mg | ORAL_TABLET | Freq: Four times a day (QID) | ORAL | Status: DC | PRN

## 2012-12-14 MED ORDER — CEFTRIAXONE SODIUM 1 G IJ SOLR
1.0000 g | INTRAMUSCULAR | Status: DC
  Administered 2012-12-14: 1 g via INTRAVENOUS
  Filled 2012-12-14 (×2): qty 10

## 2012-12-14 MED ORDER — ACETAMINOPHEN 325 MG PO TABS
650.0000 mg | ORAL_TABLET | ORAL | Status: DC | PRN
  Administered 2012-12-14 – 2012-12-15 (×2): 650 mg via ORAL
  Filled 2012-12-14 (×2): qty 2

## 2012-12-14 MED ORDER — IBUPROFEN 800 MG PO TABS
400.0000 mg | ORAL_TABLET | ORAL | Status: DC | PRN
  Administered 2012-12-14: 400 mg via ORAL
  Filled 2012-12-14: qty 1

## 2012-12-14 MED ORDER — POTASSIUM CHLORIDE IN NACL 40-0.9 MEQ/L-% IV SOLN
INTRAVENOUS | Status: DC
  Administered 2012-12-14 – 2012-12-15 (×2): via INTRAVENOUS

## 2012-12-14 MED ORDER — CEPHALEXIN 500 MG PO CAPS: 500.0000 mg | ORAL_CAPSULE | Freq: Two times a day (BID) | ORAL | Status: DC

## 2012-12-14 MED ORDER — KETOROLAC TROMETHAMINE 30 MG/ML IJ SOLN: 30.0000 mg | Freq: Four times a day (QID) | INTRAMUSCULAR | Status: DC | PRN

## 2012-12-14 MED ORDER — ONDANSETRON HCL 4 MG/2ML IJ SOLN
4.0000 mg | Freq: Four times a day (QID) | INTRAMUSCULAR | Status: DC | PRN
Start: 2012-12-14 — End: 2012-12-15
  Administered 2012-12-14: 4 mg via INTRAVENOUS
  Filled 2012-12-14: qty 2

## 2012-12-14 NOTE — Progress Notes (Signed)
Pt's BP 88/53 and HR 71. Pt asymptomatic. Dr. Orvan Falconer paged via text since SBP is lower than ordered parameters. Dr. Orvan Falconer returned page. No new orders given at this time.

## 2012-12-14 NOTE — Progress Notes (Signed)
Chart reviewed  Subjective:  Initially felt better, but vomited up lunch.  Objective: Vital signs in last 24 hours: Filed Vitals:   12/13/12 2230 12/13/12 2254 12/14/12 0528 12/14/12 1339  BP: 94/47 93/54 88/53  106/69  Pulse: 97 91 71 76  Temp:  97.9 F (36.6 C) 97.3 F (36.3 C) 97.4 F (36.3 C)  TempSrc:  Oral Oral Oral  Resp: 18 18 20 18   Height:  5\' 3"  (1.6 m)    Weight:  37.195 kg (82 lb)    SpO2: 96% 100% 100% 100%   Weight change:   Intake/Output Summary (Last 24 hours) at 12/14/12 1535 Last data filed at 12/14/12 1300  Gross per 24 hour  Intake 1033.33 ml  Output      0 ml  Net 1033.33 ml   General: Comfortable, texture and her telephone Lungs clear to auscultation bilaterally without wheeze rhonchi or rales Cardiovascular regular rate rhythm without murmurs gas rubs Abdomen soft nontender nondistended Extremities no clubbing cyanosis or edema  Lab Results: Basic Metabolic Panel:  Recent Labs Lab 12/13/12 2019 12/14/12 0541  NA 135 138  K 5.4* 3.2*  CL 107 109  CO2  --  21  GLUCOSE 84 83  BUN 4* 5*  CREATININE 0.60 0.60  CALCIUM  --  7.6*  MG  --  1.9   Liver Function Tests:  Recent Labs Lab 12/14/12 0541  AST 11  ALT 9  ALKPHOS 64  BILITOT 0.2*  PROT 5.6*  ALBUMIN 2.1*   No results found for this basename: LIPASE, AMYLASE,  in the last 168 hours No results found for this basename: AMMONIA,  in the last 168 hours CBC:  Recent Labs Lab 12/12/12 2237 12/13/12 2012 12/13/12 2019 12/14/12 0541  WBC  --  13.4*  --  11.8*  NEUTROABS  --  10.8*  --   --   HGB 13.6 10.4* 10.9* 8.6*  HCT 40.0 30.8* 32.0* 25.8*  MCV  --  86.8  --  87.8  PLT  --  328  --  277  Anemia Panel:  Recent Labs Lab 12/14/12 0904  VITAMINB12 285  FOLATE 11.3  FERRITIN 96  TIBC Not calculated due to Iron <10.  IRON <10*   Urine Drug Screen: Drugs of Abuse  No results found for this basename: labopia, cocainscrnur, labbenz, amphetmu, thcu, labbarb     Alcohol Level: No results found for this basename: ETH,  in the last 168 hours Urinalysis:  Recent Labs Lab 12/12/12 2350 12/13/12 2146  COLORURINE YELLOW YELLOW  LABSPEC 1.015 1.020  PHURINE 6.0 5.5  GLUCOSEU NEGATIVE NEGATIVE  HGBUR SMALL* TRACE*  BILIRUBINUR NEGATIVE NEGATIVE  KETONESUR >80* 40*  PROTEINUR 30* TRACE*  UROBILINOGEN 0.2 0.2  NITRITE POSITIVE* NEGATIVE  LEUKOCYTESUR MODERATE* NEGATIVE   Micro Results: Recent Results (from the past 240 hour(s))  URINE CULTURE     Status: None   Collection Time    12/12/12 11:50 PM      Result Value Range Status   Specimen Description URINE, CLEAN CATCH   Final   Special Requests NONE   Final   Culture  Setup Time 12/13/2012 00:30   Final   Colony Count >=100,000 COLONIES/ML   Final   Culture ESCHERICHIA COLI   Final   Report Status PENDING   Incomplete   Scheduled Meds: . cefTRIAXone (ROCEPHIN) IVPB 1 gram/50 mL D5W  1 g Intravenous Q24H   Continuous Infusions: . 0.9 % NaCl with KCl 40 mEq / L  PRN Meds:.acetaminophen, diphenhydrAMINE, ketorolac, ondansetron (ZOFRAN) IV, promethazine Assessment/Plan: Principal Problem:   Pyelonephritis continue ceftriaxone Active Problems:   Dehydration improved   Hyperkalemia corrected   Anemia, somewhat dilutional. No evidence of bleeding.   Hypokalemia improved Still vomiting, not yet ready for discharge.    LOS: 1 day   Olivia Cordova L 12/14/2012, 3:35 PM

## 2012-12-15 LAB — BASIC METABOLIC PANEL
CO2: 20 mEq/L (ref 19–32)
GFR calc non Af Amer: >90 mL/min (ref >90)
Glucose, Bld: 92 mg/dL (ref 70–99)
Potassium: 3.8 mEq/L (ref 3.5–5.1)
Sodium: 139 mEq/L (ref 135–145)

## 2012-12-15 LAB — CBC
Hemoglobin: 8.8 g/dL — ABNORMAL LOW (ref 12.0–15.0)
MCH: 29.1 pg (ref 26.0–34.0)
MCV: 86.8 fL (ref 78.0–100.0)
RBC: 3.02 MIL/uL — ABNORMAL LOW (ref 3.87–5.11)
WBC: 8.6 10*3/uL (ref 4.0–10.5)

## 2012-12-15 LAB — URINE CULTURE

## 2012-12-15 MED ORDER — CIPROFLOXACIN HCL 500 MG PO TABS: 500.0000 mg | ORAL_TABLET | Freq: Two times a day (BID) | ORAL | Status: AC

## 2012-12-15 MED ORDER — ACETAMINOPHEN 325 MG PO TABS: 650.0000 mg | ORAL_TABLET | ORAL | Status: AC | PRN

## 2012-12-15 NOTE — Discharge Summary (Signed)
Physician Discharge Summary  Patient ID: Olivia Cordova MRN: [id number] DOB/AGE: Apr 04, 1991 22 y.o.  Admit date: 12/13/2012 Discharge date: 12/15/2012  Discharge Diagnoses:  Principal Problem:   Pyelonephritis, e coli Active Problems:   Dehydration   Hyperkalemia   Anemia   Hypokalemia Intractable vomiting    Medication List    STOP taking these medications       cephALEXin 500 MG capsule  Commonly known as:  KEFLEX     docusate 50 MG/5ML liquid  Commonly known as:  COLACE     potassium chloride SA 20 MEQ tablet  Commonly known as:  K-DUR,KLOR-CON      TAKE these medications       acetaminophen 325 MG tablet  Commonly known as:  TYLENOL  Take 2 tablets (650 mg total) by mouth every 4 (four) hours as needed.     ciprofloxacin 500 MG tablet  Commonly known as:  CIPRO  Take 1 tablet (500 mg total) by mouth 2 (two) times daily.     ibuprofen 200 MG tablet  Commonly known as:  ADVIL,MOTRIN  Take 800 mg by mouth 2 (two) times daily as needed for pain.     ondansetron 8 MG disintegrating tablet  Commonly known as:  ZOFRAN ODT  Take 1 tablet (8 mg total) by mouth every 8 (eight) hours as needed for nausea.            Discharge Orders   Future Orders Complete By Expires     Activity as tolerated - No restrictions  As directed     Diet general  As directed          Disposition: 01-Home or Self Care  Discharged Condition: stable  Consults:  none  Labs:   Results for orders placed during the hospital encounter of 12/13/12 (from the past 48 hour(s))  CBC WITH DIFFERENTIAL     Status: Abnormal   Collection Time    12/13/12  8:12 PM      Result Value Range   WBC 13.4 (*) 4.0 - 10.5 K/uL   RBC 3.55 (*) 3.87 - 5.11 MIL/uL   Hemoglobin 10.4 (*) 12.0 - 15.0 g/dL   Comment: DELTA CHECK NOTED   HCT 30.8 (*) 36.0 - 46.0 %   MCV 86.8  78.0 - 100.0 fL   MCH 29.3  26.0 - 34.0 pg   MCHC 33.8  30.0 - 36.0 g/dL   RDW 30.8  65.7 - 84.6 %   Platelets 328  150 - 400  K/uL   Neutrophils Relative 81 (*) 43 - 77 %   Neutro Abs 10.8 (*) 1.7 - 7.7 K/uL   Lymphocytes Relative 11 (*) 12 - 46 %   Lymphs Abs 1.5  0.7 - 4.0 K/uL   Monocytes Relative 8  3 - 12 %   Monocytes Absolute 1.1 (*) 0.1 - 1.0 K/uL   Eosinophils Relative 0  0 - 5 %   Eosinophils Absolute 0.0  0.0 - 0.7 K/uL   Basophils Relative 0  0 - 1 %   Basophils Absolute 0.0  0.0 - 0.1 K/uL  POCT I-STAT, CHEM 8     Status: Abnormal   Collection Time    12/13/12  8:19 PM      Result Value Range   Sodium 135  135 - 145 mEq/L   Potassium 5.4 (*) 3.5 - 5.1 mEq/L   Chloride 107  96 - 112 mEq/L   BUN 4 (*) 6 - 23 mg/dL  Creatinine, Ser 0.60  0.50 - 1.10 mg/dL   Glucose, Bld 84  70 - 99 mg/dL   Calcium, Ion 1.61 (*) 1.12 - 1.23 mmol/L   TCO2 22  0 - 100 mmol/L   Hemoglobin 10.9 (*) 12.0 - 15.0 g/dL   HCT 09.6 (*) 04.5 - 40.9 %  URINALYSIS, ROUTINE W REFLEX MICROSCOPIC     Status: Abnormal   Collection Time    12/13/12  9:46 PM      Result Value Range   Color, Urine YELLOW  YELLOW   APPearance CLEAR  CLEAR   Specific Gravity, Urine 1.020  1.005 - 1.030   pH 5.5  5.0 - 8.0   Glucose, UA NEGATIVE  NEGATIVE mg/dL   Hgb urine dipstick TRACE (*) NEGATIVE   Bilirubin Urine NEGATIVE  NEGATIVE   Ketones, ur 40 (*) NEGATIVE mg/dL   Protein, ur TRACE (*) NEGATIVE mg/dL   Urobilinogen, UA 0.2  0.0 - 1.0 mg/dL   Nitrite NEGATIVE  NEGATIVE   Leukocytes, UA NEGATIVE  NEGATIVE  URINE MICROSCOPIC-ADD ON     Status: Abnormal   Collection Time    12/13/12  9:46 PM      Result Value Range   Squamous Epithelial / LPF FEW (*) RARE   WBC, UA 3-6  <3 WBC/hpf   RBC / HPF 3-6  <3 RBC/hpf   Bacteria, UA FEW (*) RARE  URINE CULTURE     Status: None   Collection Time    12/13/12  9:46 PM      Result Value Range   Specimen Description URINE, CLEAN CATCH     Special Requests NONE     Culture  Setup Time 12/13/2012 22:00     Colony Count 6,000 COLONIES/ML     Culture INSIGNIFICANT GROWTH     Report Status  12/15/2012 FINAL    PREGNANCY, URINE     Status: None   Collection Time    12/13/12  9:46 PM      Result Value Range   Preg Test, Ur NEGATIVE  NEGATIVE   Comment:            THE SENSITIVITY OF THIS     METHODOLOGY IS >20 mIU/mL.  MAGNESIUM     Status: None   Collection Time    12/14/12  5:41 AM      Result Value Range   Magnesium 1.9  1.5 - 2.5 mg/dL  CBC     Status: Abnormal   Collection Time    12/14/12  5:41 AM      Result Value Range   WBC 11.8 (*) 4.0 - 10.5 K/uL   RBC 2.94 (*) 3.87 - 5.11 MIL/uL   Hemoglobin 8.6 (*) 12.0 - 15.0 g/dL   Comment: DELTA CHECK NOTED     RESULT REPEATED AND VERIFIED   HCT 25.8 (*) 36.0 - 46.0 %   MCV 87.8  78.0 - 100.0 fL   MCH 29.3  26.0 - 34.0 pg   MCHC 33.3  30.0 - 36.0 g/dL   RDW 81.1  91.4 - 78.2 %   Platelets 277  150 - 400 K/uL  COMPREHENSIVE METABOLIC PANEL     Status: Abnormal   Collection Time    12/14/12  5:41 AM      Result Value Range   Sodium 138  135 - 145 mEq/L   Potassium 3.2 (*) 3.5 - 5.1 mEq/L   Comment: DELTA CHECK NOTED   Chloride 109  96 - 112  mEq/L   CO2 21  19 - 32 mEq/L   Glucose, Bld 83  70 - 99 mg/dL   BUN 5 (*) 6 - 23 mg/dL   Creatinine, Ser 9.60  0.50 - 1.10 mg/dL   Calcium 7.6 (*) 8.4 - 10.5 mg/dL   Total Protein 5.6 (*) 6.0 - 8.3 g/dL   Albumin 2.1 (*) 3.5 - 5.2 g/dL   AST 11  0 - 37 U/L   ALT 9  0 - 35 U/L   Alkaline Phosphatase 64  39 - 117 U/L   Total Bilirubin 0.2 (*) 0.3 - 1.2 mg/dL   GFR calc non Af Amer >90  >90 mL/min   GFR calc Af Amer >90  >90 mL/min   Comment:            The eGFR has been calculated     using the CKD EPI equation.     This calculation has not been     validated in all clinical     situations.     eGFR's persistently     <90 mL/min signify     possible Chronic Kidney Disease.  VITAMIN B12     Status: None   Collection Time    12/14/12  9:04 AM      Result Value Range   Vitamin B-12 285  211 - 911 pg/mL  FOLATE     Status: None   Collection Time    12/14/12   9:04 AM      Result Value Range   Folate 11.3     Comment: (NOTE)     Reference Ranges            Deficient:       0.4 - 3.3 ng/mL            Indeterminate:   3.4 - 5.4 ng/mL            Normal:              > 5.4 ng/mL     CORRECTED ON 02/12 AT 1335: PREVIOUSLY REPORTED AS 11.3  IRON AND TIBC     Status: Abnormal   Collection Time    12/14/12  9:04 AM      Result Value Range   Iron <10 (*) 42 - 135 ug/dL   TIBC Not calculated due to Iron <10.  250 - 470 ug/dL   Saturation Ratios Not calculated due to Iron <10.  20 - 55 %   UIBC 134  125 - 400 ug/dL  FERRITIN     Status: None   Collection Time    12/14/12  9:04 AM      Result Value Range   Ferritin 96  10 - 291 ng/mL  RETICULOCYTES     Status: Abnormal   Collection Time    12/14/12  9:04 AM      Result Value Range   Retic Ct Pct 0.3 (*) 0.4 - 3.1 %   Comment: REPEATED TO VERIFY   RBC. 2.99 (*) 3.87 - 5.11 MIL/uL   Retic Count, Manual 9.0 (*) 19.0 - 186.0 K/uL  BASIC METABOLIC PANEL     Status: Abnormal   Collection Time    12/15/12  4:58 AM      Result Value Range   Sodium 139  135 - 145 mEq/L   Potassium 3.8  3.5 - 5.1 mEq/L   Chloride 110  96 - 112 mEq/L   CO2 20  19 - 32  mEq/L   Glucose, Bld 92  70 - 99 mg/dL   BUN 5 (*) 6 - 23 mg/dL   Creatinine, Ser 1.61 (*) 0.50 - 1.10 mg/dL   Calcium 8.1 (*) 8.4 - 10.5 mg/dL   GFR calc non Af Amer >90  >90 mL/min   GFR calc Af Amer >90  >90 mL/min   Comment:            The eGFR has been calculated     using the CKD EPI equation.     This calculation has not been     validated in all clinical     situations.     eGFR's persistently     <90 mL/min signify     possible Chronic Kidney Disease.  CBC     Status: Abnormal   Collection Time    12/15/12  4:58 AM      Result Value Range   WBC 8.6  4.0 - 10.5 K/uL   RBC 3.02 (*) 3.87 - 5.11 MIL/uL   Hemoglobin 8.8 (*) 12.0 - 15.0 g/dL   HCT 09.6 (*) 04.5 - 40.9 %   MCV 86.8  78.0 - 100.0 fL   MCH 29.1  26.0 - 34.0 pg   MCHC  33.6  30.0 - 36.0 g/dL   RDW 81.1  91.4 - 78.2 %   Platelets 324  150 - 400 K/uL    Diagnostics:  No results found.  Full Code   Hospital Course: See H&P for complete admission details. The patient is a pleasant 22 year old white female who was diagnosed with a urinary tract infection prior to admission. She was given a prescription for Keflex, 800 mg of ibuprofen, potassium and odanseron. She had intractable vomiting and therefore presented back to the emergency room. In the emergency room, she was found to be hyperkalemic from the potassium repletion. She had right CVA tenderness. She was started on ceftriaxone. Culture results have come back as greater than 100,000 colonies of Escherichia coli. It is sensitive to fluoroquinolones. Only intermediate to Ancef, so I recommend she stop the Keflex and instead take Cipro. Currently she is tolerating a diet without any vomiting, requesting discharge. Her vital signs have been stable. After hydration, her repeat potassium was actually borderline low and this was repleted. On the day of discharge, her potassium is normal  Discharge Exam:  Blood pressure 106/71, pulse 74, temperature 98 F (36.7 C), temperature source Oral, resp. rate 20, height 5\' 3"  (1.6 m), weight 37.195 kg (82 lb), last menstrual period 12/08/2012, SpO2 100.00%.  General comfortable eating breakfast nontoxic. Lungs clear to auscultation bilaterally without wheeze rhonchi or rales Cardiovascular regular rate rhythm without murmurs gallops rubs Abdomen soft nontender nondistended Back no CVA tenderness  Signed: Maicee Ullman L 12/15/2012, 9:41 AM

## 2012-12-15 NOTE — Progress Notes (Signed)
Patient given discharge instructions with no questions. Patient ambulated out of facility with friend/significant other. Refused assistance out.

## 2012-12-18 LAB — CULTURE, BLOOD (ROUTINE X 2)

## 2021-08-15 ENCOUNTER — Ambulatory Visit: Admit: 2021-08-15 | Payer: Self-pay | Source: Home / Self Care

## 2021-08-15 ENCOUNTER — Encounter: Payer: Self-pay | Admitting: Emergency Medicine

## 2021-08-15 ENCOUNTER — Other Ambulatory Visit: Payer: Self-pay

## 2021-08-15 ENCOUNTER — Ambulatory Visit
Admission: EM | Admit: 2021-08-15 | Discharge: 2021-08-15 | Disposition: A | Discharge status: Home / Self Care | Payer: Medicaid Other | Attending: Physician Assistant | Admitting: Physician Assistant

## 2021-08-15 DIAGNOSIS — R112 Nausea with vomiting, unspecified: Secondary | ICD-10-CM

## 2021-08-15 DIAGNOSIS — R197 Diarrhea, unspecified: Secondary | ICD-10-CM

## 2021-08-15 MED ORDER — ONDANSETRON 4 MG PO TBDP: 4.0000 mg | ORAL_TABLET | Freq: Three times a day (TID) | ORAL | 0 refills | Status: AC | PRN

## 2021-08-15 NOTE — ED Triage Notes (Signed)
Migraine with chills, runny nose, nausea, vomiting, diarrhea staring this past Tuesday. Denies abdominal pain. Reports when she's felt like this in the past, she's been septic from an unknown cause and had to go to the hospital for IV antibiotics.

## 2021-08-15 NOTE — ED Provider Notes (Signed)
[facility]    CSN: [id number] Arrival date & time: 08/15/21  1046      History   Chief Complaint Chief Complaint  Patient presents with   Nausea   Diarrhea    HPI Olivia Cordova is a 30 y.o. female.   Patient here today for evaluation of nausea, vomiting and diarrhea that started a few days ago.  She reports that she has had some runny nose but denies any cough or other congestion.  She has not had fever but has had chills.  She is concerned because she has had sepsis in the past and she felt similar to this.  She has only tried ibuprofen because she had a headache, which was helpful for same.  The history is provided by the patient.  Diarrhea Associated symptoms: abdominal pain, chills, headaches and vomiting   Associated symptoms: no fever    Past Medical History:  Diagnosis Date   Hypertension     Patient Active Problem List   Diagnosis Date Noted   Anemia 12/14/2012   Hypokalemia 12/14/2012   Dehydration 12/13/2012   Hyperkalemia 12/13/2012   Pyelonephritis 12/13/2012    Past Surgical History:  Procedure Laterality Date   PARTIAL HYSTERECTOMY      OB History   No obstetric history on file.      Home Medications    Prior to Admission medications   Medication Sig Start Date End Date Taking? Authorizing Provider  ondansetron (ZOFRAN-ODT) 4 MG disintegrating tablet Take 1 tablet (4 mg total) by mouth every 8 (eight) hours as needed for nausea or vomiting. 08/15/21  Yes Tomi Bamberger, PA-C  acetaminophen (TYLENOL) 325 MG tablet Take 2 tablets (650 mg total) by mouth every 4 (four) hours as needed. 12/15/12   Christiane Ha, MD  ciprofloxacin (CIPRO) 500 MG tablet Take 1 tablet (500 mg total) by mouth 2 (two) times daily. 12/15/12   Christiane Ha, MD  ibuprofen (ADVIL,MOTRIN) 200 MG tablet Take 800 mg by mouth 2 (two) times daily as needed for pain.    [provider]    Family History History reviewed. No pertinent family  history.  Social History Social History   Tobacco Use   Smoking status: Never   Smokeless tobacco: Never  Substance Use Topics   Alcohol use: No   Drug use: No     Allergies   Patient has no known allergies.   Review of Systems Review of Systems  Constitutional:  Positive for chills. Negative for fever.  HENT:  Negative for congestion, ear pain and sore throat.   Eyes:  Negative for discharge and redness.  Respiratory:  Negative for cough and shortness of breath.   Gastrointestinal:  Positive for abdominal pain, diarrhea, nausea and vomiting.  Neurological:  Positive for headaches.    Physical Exam Triage Vital Signs ED Triage Vitals [08/15/21 1141]  Enc Vitals Group     BP 126/87     Pulse Rate 83     Resp 16     Temp 98.1 F (36.7 C)     Temp Source Oral     SpO2 98 %     Weight      Height      Head Circumference      Peak Flow      Pain Score 0     Pain Loc      Pain Edu?      Excl. in GC?    No data found.  Updated Vital Signs BP 126/87 (BP Location: Left Arm)   Pulse 83   Temp 98.1 F (36.7 C) (Oral)   Resp 16   LMP 12/08/2012   SpO2 98%      Physical Exam Vitals and nursing note reviewed.  Constitutional:      General: She is not in acute distress.    Appearance: Normal appearance. She is not ill-appearing, toxic-appearing or diaphoretic.  HENT:     Head: Normocephalic and atraumatic.     Nose: Nose normal. No congestion or rhinorrhea.     Mouth/Throat:     Mouth: Mucous membranes are moist.     Pharynx: Oropharynx is clear. No oropharyngeal exudate or posterior oropharyngeal erythema.  Eyes:     Conjunctiva/sclera: Conjunctivae normal.  Cardiovascular:     Rate and Rhythm: Normal rate and regular rhythm.     Heart sounds: Normal heart sounds. No murmur heard. Pulmonary:     Effort: Pulmonary effort is normal. No respiratory distress.     Breath sounds: Normal breath sounds. No wheezing, rhonchi or rales.  Abdominal:     General:  Abdomen is flat. Bowel sounds are normal. There is no distension.     Palpations: Abdomen is soft.     Tenderness: There is no abdominal tenderness. There is no guarding.  Skin:    General: Skin is warm and dry.  Neurological:     Mental Status: She is alert.  Psychiatric:        Mood and Affect: Mood normal.        Behavior: Behavior normal.     UC Treatments / Results  Labs (all labs ordered are listed, but only abnormal results are displayed) Labs Reviewed  CBC WITH DIFFERENTIAL/PLATELET  COMPREHENSIVE METABOLIC PANEL    EKG   Radiology No results found.  Procedures Procedures (including critical care time)  Medications Ordered in UC Medications - No data to display  Initial Impression / Assessment and Plan / UC Course  I have reviewed the triage vital signs and the nursing notes.  Pertinent labs & imaging results that were available during my care of the patient were reviewed by me and considered in my medical decision making (see chart for details).  Physical exam unremarkable, discussed possible viral gastroenteritis but will order labs given history.  Zofran prescribed for nausea.  Encouraged follow-up if symptoms fail to improve or worsen.  Final Clinical Impressions(s) / UC Diagnoses   Final diagnoses:  Nausea vomiting and diarrhea     Discharge Instructions      Follow bland diet. Follow up with any further concerns.      ED Prescriptions     Medication Sig Dispense Auth. Provider   ondansetron (ZOFRAN-ODT) 4 MG disintegrating tablet Take 1 tablet (4 mg total) by mouth every 8 (eight) hours as needed for nausea or vomiting. 20 tablet Tomi Bamberger, PA-C      PDMP not reviewed this encounter.   Tomi Bamberger, PA-C 08/15/21 1207

## 2021-08-15 NOTE — Discharge Instructions (Addendum)
Follow bland diet. Follow up with any further concerns.  

## 2021-08-16 LAB — CBC WITH DIFFERENTIAL/PLATELET
Basophils Absolute: 0 10*3/uL (ref 0.0–0.2)
Basos: 1 %
EOS (ABSOLUTE): 0.1 10*3/uL (ref 0.0–0.4)
Eos: 1 %
Hematocrit: 37.6 % (ref 34.0–46.6)
Hemoglobin: 13.1 g/dL (ref 11.1–15.9)
Immature Grans (Abs): 0 10*3/uL (ref 0.0–0.1)
Immature Granulocytes: 0 %
Lymphocytes Absolute: 2 10*3/uL (ref 0.7–3.1)
Lymphs: 28 %
MCH: 33.8 pg — ABNORMAL HIGH (ref 26.6–33.0)
MCHC: 34.8 g/dL (ref 31.5–35.7)
MCV: 97 fL (ref 79–97)
Monocytes Absolute: 0.4 10*3/uL (ref 0.1–0.9)
Monocytes: 5 %
Neutrophils Absolute: 4.6 10*3/uL (ref 1.4–7.0)
Neutrophils: 65 %
Platelets: 301 10*3/uL (ref 150–450)
RBC: 3.88 x10E6/uL (ref 3.77–5.28)
RDW: 12.8 % (ref 11.7–15.4)
WBC: 7.2 10*3/uL (ref 3.4–10.8)

## 2021-08-16 LAB — COMPREHENSIVE METABOLIC PANEL
ALT: 12 IU/L (ref 0–32)
AST: 27 IU/L (ref 0–40)
Albumin/Globulin Ratio: 1.3 (ref 1.2–2.2)
Albumin: 4.6 g/dL (ref 3.9–5.0)
Alkaline Phosphatase: 103 IU/L (ref 44–121)
BUN/Creatinine Ratio: 12 (ref 9–23)
BUN: 7 mg/dL (ref 6–20)
Bilirubin Total: 0.7 mg/dL (ref 0.0–1.2)
CO2: 23 mmol/L (ref 20–29)
Calcium: 9.1 mg/dL (ref 8.7–10.2)
Chloride: 99 mmol/L (ref 96–106)
Creatinine, Ser: 0.58 mg/dL (ref 0.57–1.00)
Globulin, Total: 3.6 g/dL (ref 1.5–4.5)
Glucose: 78 mg/dL (ref 70–99)
Potassium: 4.1 mmol/L (ref 3.5–5.2)
Sodium: 139 mmol/L (ref 134–144)
Total Protein: 8.2 g/dL (ref 6.0–8.5)
eGFR: 126 mL/min/{1.73_m2} (ref >59)
